# Patient Record
Sex: Female | Born: 1955 | Race: White | Hispanic: No | Smoking: Never smoker
Health system: Southern US, Community
[De-identification: ages and names within clinical notes are randomized; demographics above are authoritative.]

## PROBLEM LIST (undated history)

## (undated) DIAGNOSIS — F4323 Adjustment disorder with mixed anxiety and depressed mood: Secondary | ICD-10-CM

## (undated) DIAGNOSIS — Z789 Other specified health status: Secondary | ICD-10-CM

---

## 2018-12-11 DIAGNOSIS — F439 Reaction to severe stress, unspecified: Secondary | ICD-10-CM | POA: Insufficient documentation

## 2018-12-11 DIAGNOSIS — K5790 Diverticulosis of intestine, part unspecified, without perforation or abscess without bleeding: Secondary | ICD-10-CM | POA: Insufficient documentation

## 2018-12-11 DIAGNOSIS — Z87892 Personal history of anaphylaxis: Secondary | ICD-10-CM | POA: Insufficient documentation

## 2019-01-03 ENCOUNTER — Other Ambulatory Visit: Payer: Self-pay | Admitting: Internal Medicine

## 2019-01-03 DIAGNOSIS — Z1231 Encounter for screening mammogram for malignant neoplasm of breast: Secondary | ICD-10-CM

## 2019-01-15 ENCOUNTER — Ambulatory Visit
Admission: RE | Admit: 2019-01-15 | Discharge: 2019-01-15 | Disposition: A | Discharge status: Home / Self Care | Payer: BC Managed Care – PPO | Source: Ambulatory Visit | Attending: Internal Medicine | Admitting: Internal Medicine

## 2019-01-15 ENCOUNTER — Encounter: Payer: Self-pay | Admitting: Radiology

## 2019-01-15 DIAGNOSIS — Z1231 Encounter for screening mammogram for malignant neoplasm of breast: Secondary | ICD-10-CM | POA: Insufficient documentation

## 2019-03-12 ENCOUNTER — Ambulatory Visit
Admission: EM | Admit: 2019-03-12 | Discharge: 2019-03-12 | Disposition: A | Discharge status: Home / Self Care | Payer: BC Managed Care – PPO | Attending: Family Medicine | Admitting: Family Medicine

## 2019-03-12 ENCOUNTER — Encounter: Payer: Self-pay | Admitting: Emergency Medicine

## 2019-03-12 ENCOUNTER — Other Ambulatory Visit: Payer: Self-pay

## 2019-03-12 DIAGNOSIS — Z20822 Contact with and (suspected) exposure to covid-19: Secondary | ICD-10-CM | POA: Diagnosis not present

## 2019-03-12 NOTE — Discharge Instructions (Signed)
We will notify you by phone of any positive test results.  Negative tests are resulted and relayed to you through your MyChart.

## 2019-03-12 NOTE — ED Triage Notes (Signed)
Patient here for COVID testing. No symptoms. Needs this for travel.

## 2019-03-12 NOTE — ED Provider Notes (Signed)
[facility]    CSN: [id number] Arrival date & time: 03/12/19  1814      History   Chief Complaint Chief Complaint  Patient presents with  . COVID testing    HPI Paizleigh Wilds is a 64 y.o. female.   Yalda Herd presents with requests for covid-19 testing. She is travelling to [place] to visit her mother in an assisted living situation, therefore is seeking testing prior to this visit for peace of mind for herself and the facility. She is feeling well and denies any symptoms of covid-19. She has not had any known exposures to covid-19.    ROS per HPI, negative if not otherwise mentioned.      History reviewed. No pertinent past medical history.  There are no problems to display for this patient.   History reviewed. No pertinent surgical history.  OB History   No obstetric history on file.      Home Medications    Prior to Admission medications   Not on File    Family History History reviewed. No pertinent family history.  Social History Social History   Tobacco Use  . Smoking status: Never Smoker  . Smokeless tobacco: Never Used  Substance Use Topics  . Alcohol use: Never  . Drug use: Never     Allergies   Patient has no known allergies.   Review of Systems Review of Systems   Physical Exam Triage Vital Signs ED Triage Vitals  Enc Vitals Group     BP 03/12/19 1835 115/87     Pulse Rate 03/12/19 1835 72     Resp 03/12/19 1835 18     Temp 03/12/19 1835 98.3 F (36.8 C)     Temp Source 03/12/19 1835 Oral     SpO2 03/12/19 1835 99 %     Weight 03/12/19 1833 120 lb (54.4 kg)     Height 03/12/19 1833 5\' 5"  (1.651 m)     Head Circumference --      Peak Flow --      Pain Score 03/12/19 1833 0     Pain Loc --      Pain Edu? --      Excl. in GC? --    No data found.  Updated Vital Signs BP 115/87 (BP Location: Right Arm)   Pulse 72   Temp 98.3 F (36.8 C) (Oral)   Resp 18   Ht 5\' 5"  (1.651 m)   Wt 120 lb (54.4 kg)    SpO2 99%   BMI 19.97 kg/m   Physical Exam Constitutional:      General: She is not in acute distress.    Appearance: She is well-developed.  Cardiovascular:     Rate and Rhythm: Normal rate.  Pulmonary:     Effort: Pulmonary effort is normal.  Skin:    General: Skin is warm and dry.  Neurological:     Mental Status: She is alert and oriented to person, place, and time.      UC Treatments / Results  Labs (all labs ordered are listed, but only abnormal results are displayed) Labs Reviewed  NOVEL CORONAVIRUS, NAA (HOSP ORDER, SEND-OUT TO REF LAB; TAT 18-24 HRS)    EKG   Radiology No results found.  Procedures Procedures (including critical care time)  Medications Ordered in UC Medications - No data to display  Initial Impression / Assessment and Plan / UC Course  I have reviewed the triage vital signs and the nursing notes.  Pertinent labs & imaging results that were available during my care of the patient were reviewed by me and considered in my medical decision making (see chart for details).     No acute complaints tonight. covid-19 testing prior to travel to visit her mother in assisted living. Testing expectations and results time frame discussed. Patient verbalized understanding and agreeable to plan.   Final Clinical Impressions(s) / UC Diagnoses   Final diagnoses:  Encounter for laboratory testing for COVID-19 virus     Discharge Instructions     We will notify you by phone of any positive test results.  Negative tests are resulted and relayed to you through your MyChart.     ED Prescriptions    None     PDMP not reviewed this encounter.   Zigmund Gottron, NP 03/12/19 1851

## 2019-03-14 LAB — NOVEL CORONAVIRUS, NAA (HOSP ORDER, SEND-OUT TO REF LAB; TAT 18-24 HRS): SARS-CoV-2, NAA: NOT DETECTED

## 2020-03-02 ENCOUNTER — Other Ambulatory Visit: Payer: Self-pay | Admitting: Internal Medicine

## 2020-03-02 DIAGNOSIS — Z1231 Encounter for screening mammogram for malignant neoplasm of breast: Secondary | ICD-10-CM

## 2020-03-16 ENCOUNTER — Other Ambulatory Visit: Payer: Self-pay

## 2020-03-16 ENCOUNTER — Ambulatory Visit
Admission: RE | Admit: 2020-03-16 | Discharge: 2020-03-16 | Disposition: A | Discharge status: Home / Self Care | Payer: BC Managed Care – PPO | Source: Ambulatory Visit | Attending: Internal Medicine | Admitting: Internal Medicine

## 2020-03-16 DIAGNOSIS — Z1231 Encounter for screening mammogram for malignant neoplasm of breast: Secondary | ICD-10-CM | POA: Insufficient documentation

## 2020-07-30 DIAGNOSIS — Z0000 Encounter for general adult medical examination without abnormal findings: Secondary | ICD-10-CM | POA: Insufficient documentation

## 2021-04-28 ENCOUNTER — Other Ambulatory Visit: Payer: Self-pay | Admitting: Internal Medicine

## 2021-04-28 DIAGNOSIS — Z1231 Encounter for screening mammogram for malignant neoplasm of breast: Secondary | ICD-10-CM

## 2021-06-07 ENCOUNTER — Ambulatory Visit
Admission: RE | Admit: 2021-06-07 | Discharge: 2021-06-07 | Disposition: A | Discharge status: Home / Self Care | Payer: Medicare Other | Source: Ambulatory Visit | Attending: Internal Medicine | Admitting: Internal Medicine

## 2021-06-07 DIAGNOSIS — Z1231 Encounter for screening mammogram for malignant neoplasm of breast: Secondary | ICD-10-CM | POA: Diagnosis not present

## 2021-12-16 ENCOUNTER — Ambulatory Visit
Admission: EM | Admit: 2021-12-16 | Discharge: 2021-12-16 | Disposition: A | Discharge status: Home / Self Care | Payer: Medicare Other

## 2021-12-22 DIAGNOSIS — F331 Major depressive disorder, recurrent, moderate: Secondary | ICD-10-CM | POA: Insufficient documentation

## 2021-12-22 DIAGNOSIS — F330 Major depressive disorder, recurrent, mild: Secondary | ICD-10-CM | POA: Insufficient documentation

## 2021-12-23 IMAGING — MG MM DIGITAL SCREENING BILAT W/ TOMO AND CAD
2 series · 3 of 6 positions shown · non-contrast
Comparison: Previous exam(s).

CLINICAL DATA: Screening.

EXAM:
DIGITAL SCREENING BILATERAL MAMMOGRAM WITH TOMOSYNTHESIS AND CAD
TECHNIQUE: Bilateral screening digital craniocaudal and mediolateral oblique
mammograms were obtained. Bilateral screening digital breast
tomosynthesis was performed. The images were evaluated with
computer-aided detection.

[R CC synth-2D]
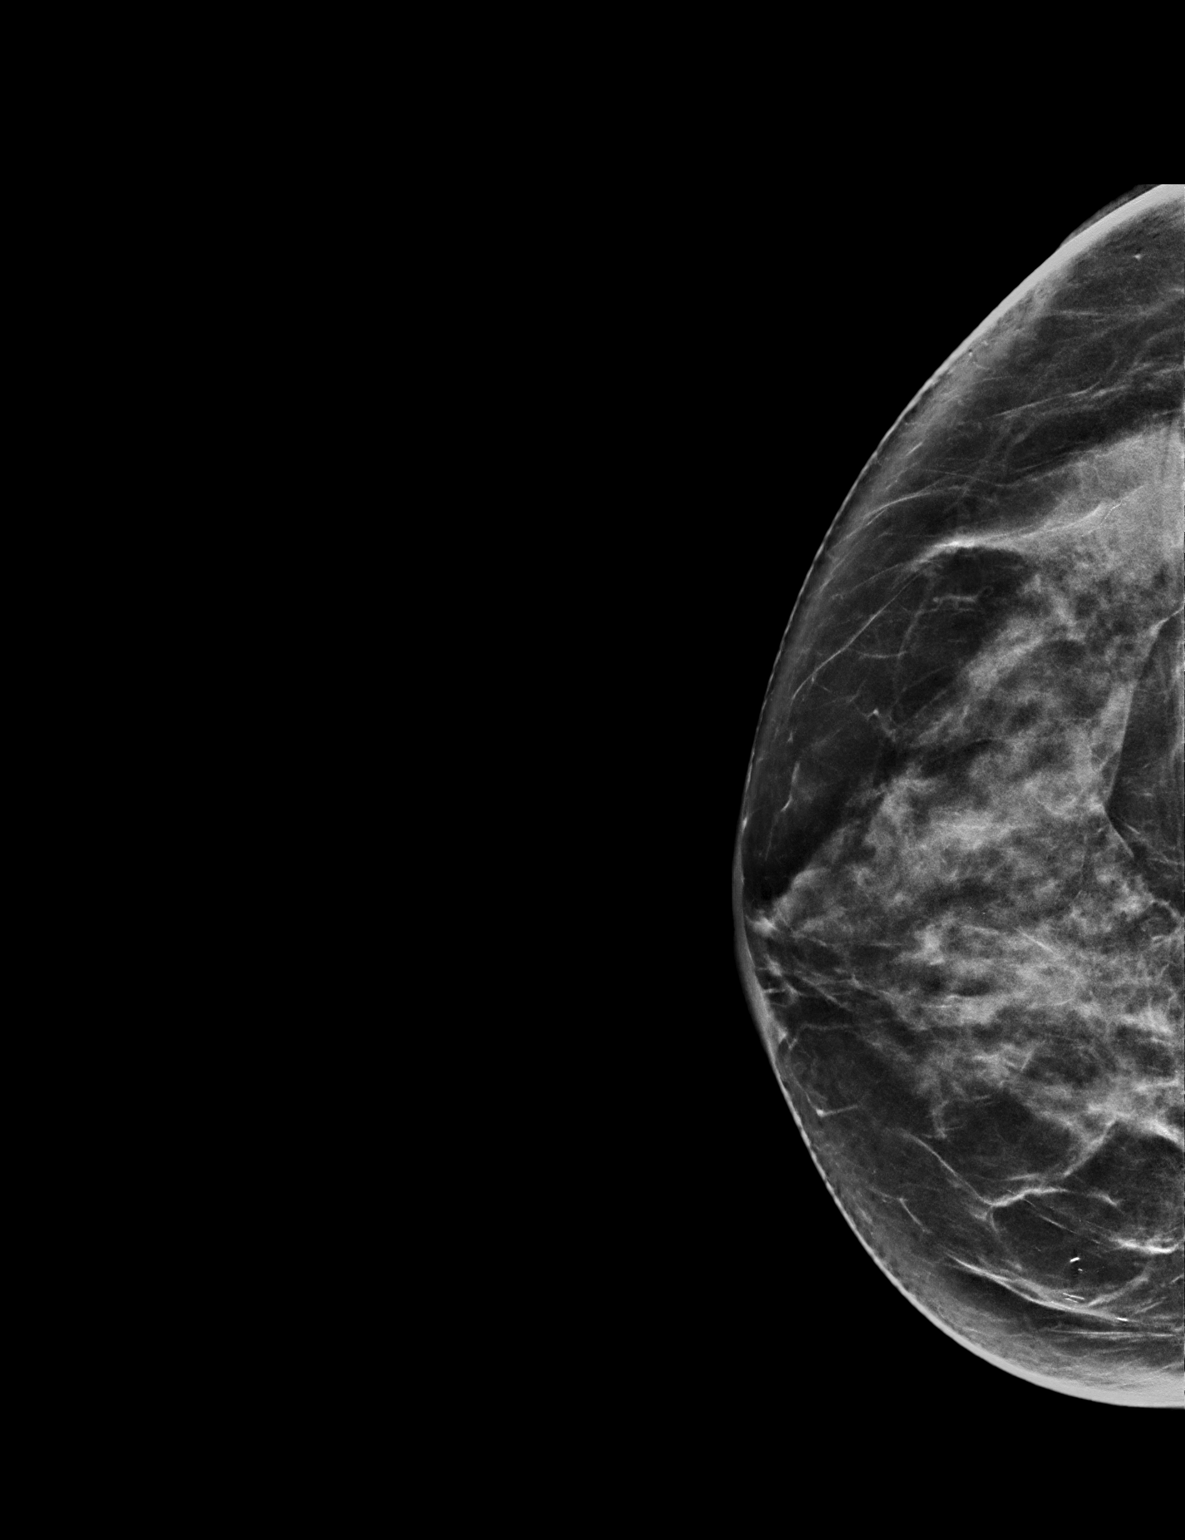

[R CC tomo · 2 of 68 frames shown]
[frame 22/68]
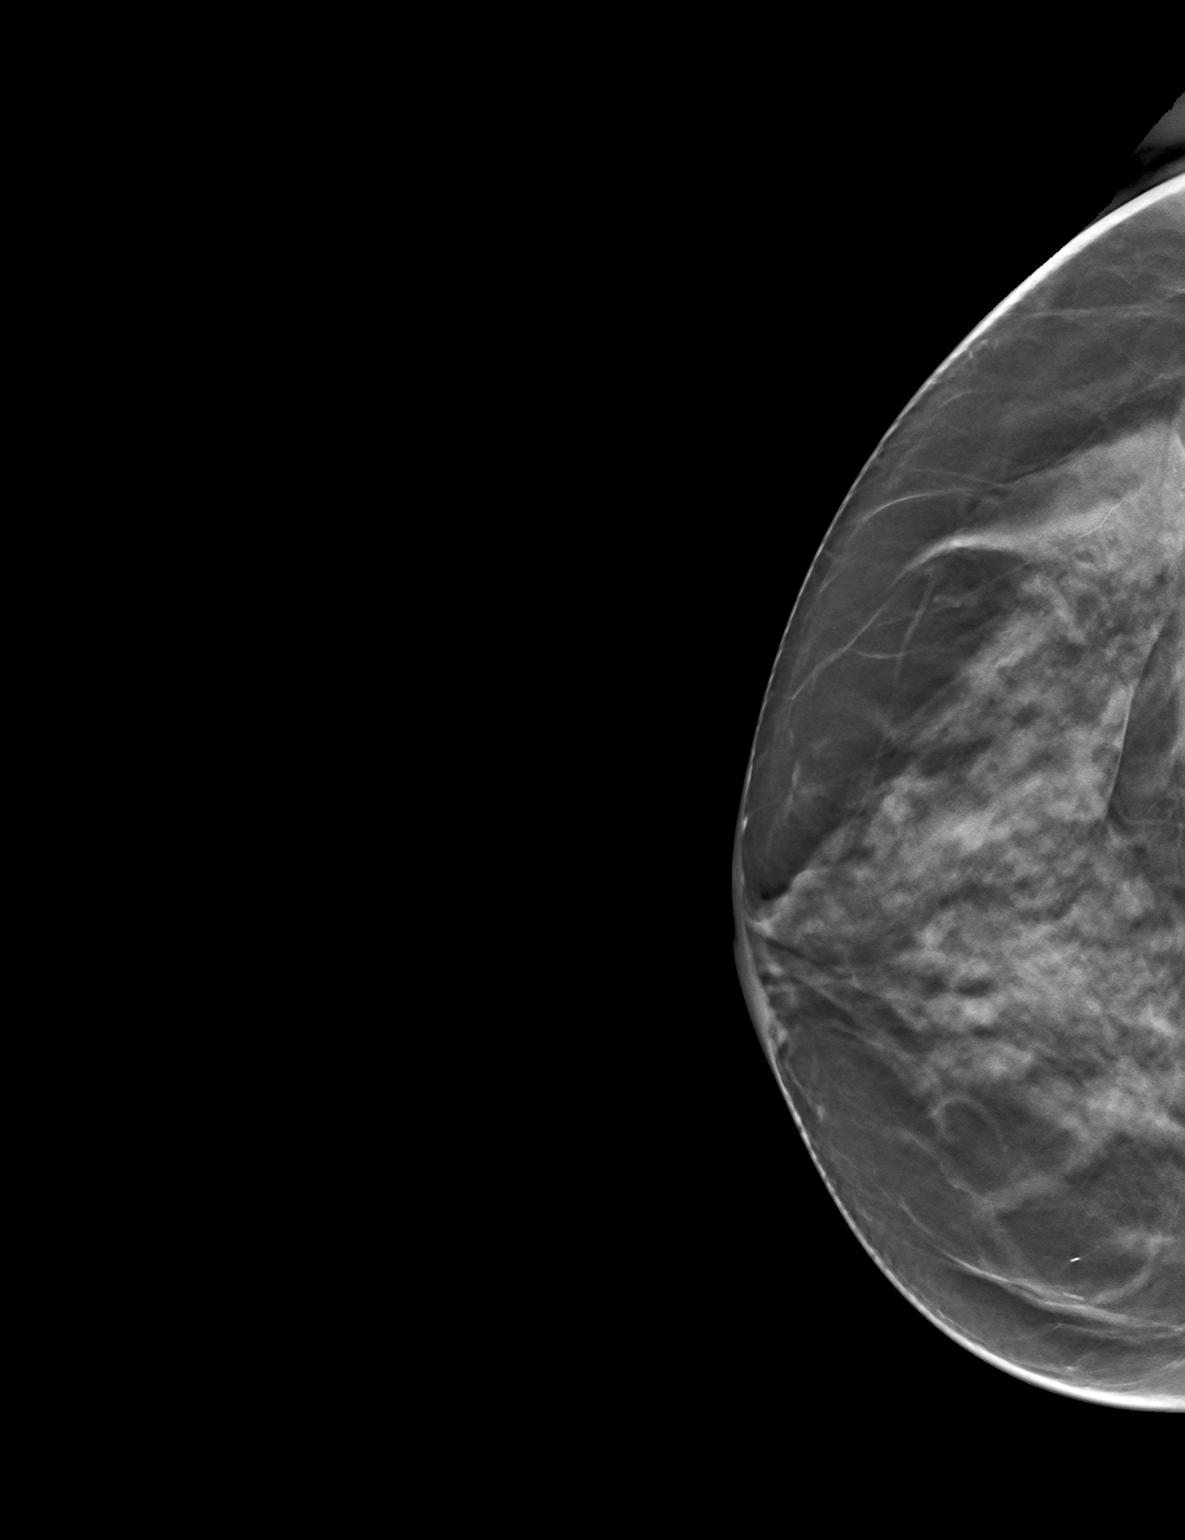
[frame 35/68]
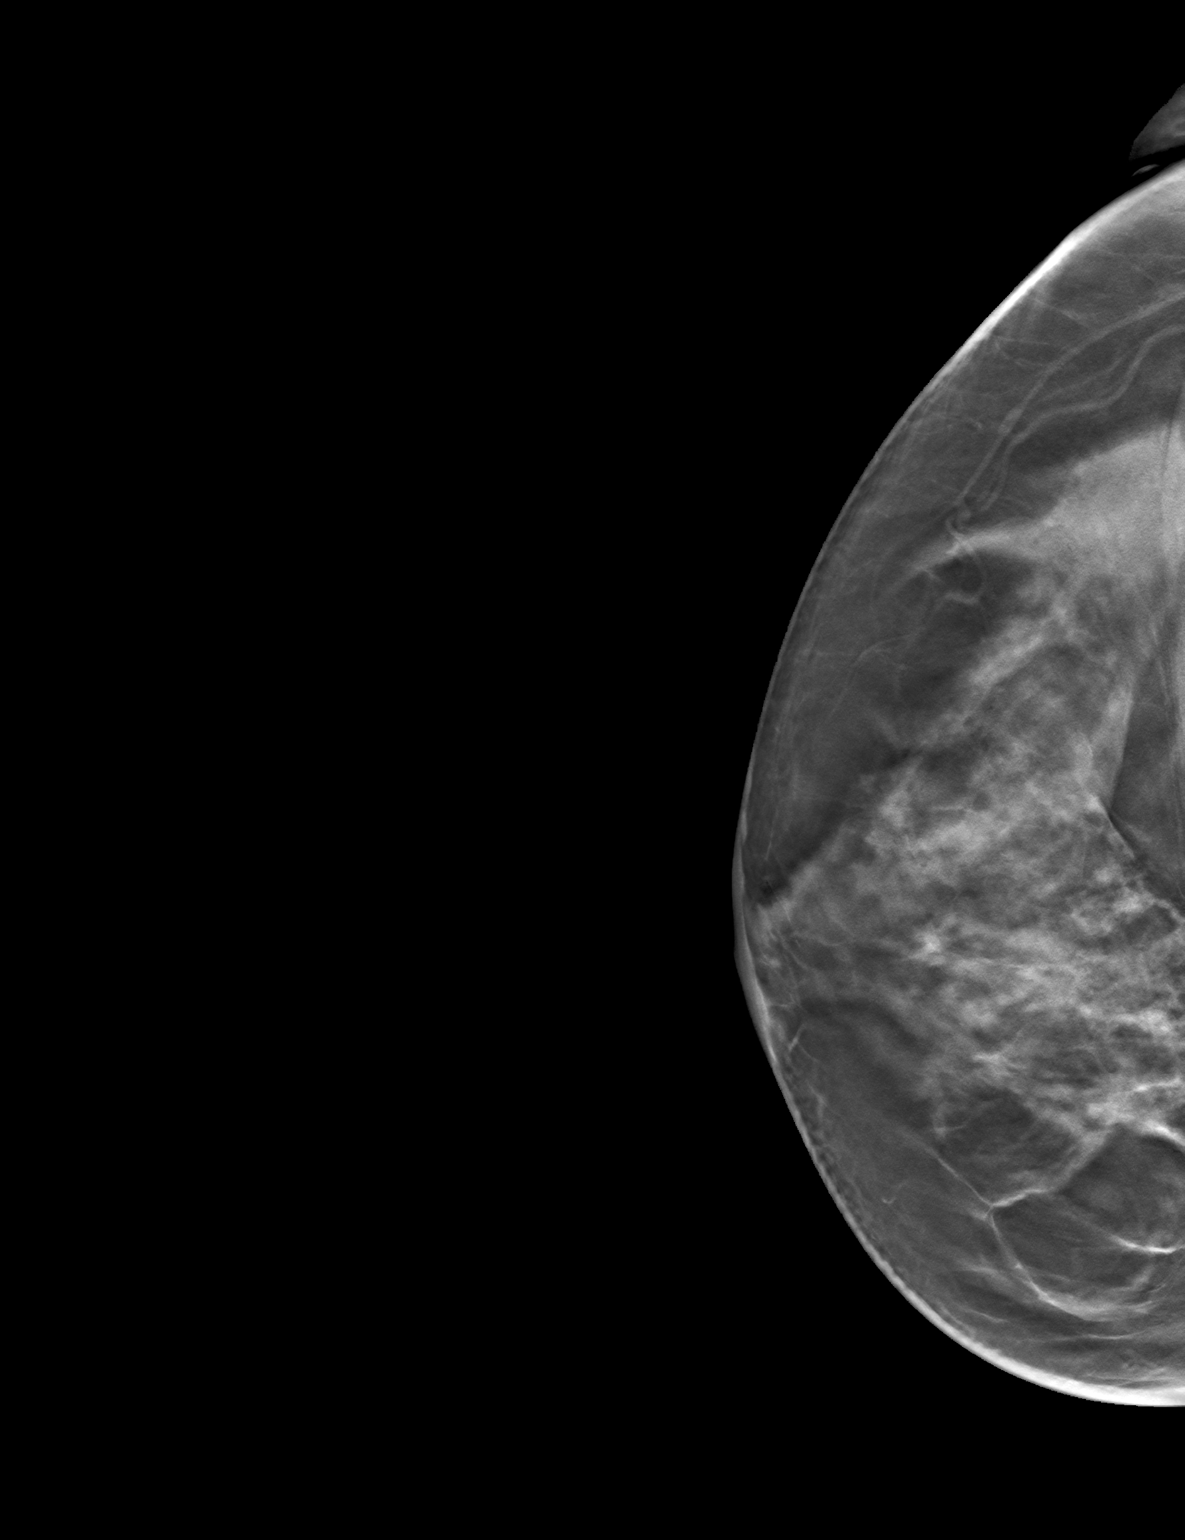

[3 of 6 positions shown; findings below may reference images not displayed]

ACR Breast Density Category c: The breast tissue is heterogeneously
dense, which may obscure small masses.
FINDINGS: There are no findings suspicious for malignancy. The images were
evaluated with computer-aided detection.
IMPRESSION: No mammographic evidence of malignancy. A result letter of this
screening mammogram will be mailed directly to the patient.

RECOMMENDATION:
Screening mammogram in one year. (Code:T4-5-GWO)

BI-RADS CATEGORY  1: Negative.

## 2022-02-15 DIAGNOSIS — Z681 Body mass index (BMI) 19 or less, adult: Secondary | ICD-10-CM | POA: Insufficient documentation

## 2022-03-22 DIAGNOSIS — F4321 Adjustment disorder with depressed mood: Secondary | ICD-10-CM | POA: Insufficient documentation

## 2022-03-22 DIAGNOSIS — F4323 Adjustment disorder with mixed anxiety and depressed mood: Secondary | ICD-10-CM | POA: Insufficient documentation

## 2022-04-18 DIAGNOSIS — K115 Sialolithiasis: Secondary | ICD-10-CM | POA: Insufficient documentation

## 2022-05-01 ENCOUNTER — Other Ambulatory Visit: Payer: Self-pay | Admitting: Internal Medicine

## 2022-05-01 DIAGNOSIS — Z1231 Encounter for screening mammogram for malignant neoplasm of breast: Secondary | ICD-10-CM

## 2022-05-22 ENCOUNTER — Telehealth: Payer: Self-pay

## 2022-05-22 NOTE — Telephone Encounter (Signed)
Patient left a message stating her PCP referred her to our office for blood in stool and she needs to make a appointment. Reviewed chart and do not have the referral referring her to our office. Called patient back and informed her this information and she states she will get her provider to refax it to our office.

## 2022-05-23 DIAGNOSIS — N950 Postmenopausal bleeding: Secondary | ICD-10-CM | POA: Insufficient documentation

## 2022-05-29 ENCOUNTER — Other Ambulatory Visit: Payer: Self-pay | Admitting: Obstetrics and Gynecology

## 2022-05-30 NOTE — Telephone Encounter (Signed)
Pt left another message regarding the referral to our office. Returned pt's call but had to leave a message for pt. Requested a call to confirm PCP as I will call myself to locate the referral.

## 2022-06-05 ENCOUNTER — Other Ambulatory Visit: Payer: Self-pay

## 2022-06-12 ENCOUNTER — Ambulatory Visit
Admission: RE | Admit: 2022-06-12 | Discharge: 2022-06-12 | Disposition: A | Discharge status: Home / Self Care | Payer: Medicare Other | Source: Ambulatory Visit | Attending: Internal Medicine | Admitting: Internal Medicine

## 2022-06-12 DIAGNOSIS — Z1231 Encounter for screening mammogram for malignant neoplasm of breast: Secondary | ICD-10-CM | POA: Insufficient documentation

## 2022-06-13 ENCOUNTER — Inpatient Hospital Stay: Admission: RE | Admit: 2022-06-13 | Payer: Medicare Other | Source: Ambulatory Visit

## 2022-07-20 ENCOUNTER — Other Ambulatory Visit: Payer: Self-pay

## 2022-07-20 DIAGNOSIS — Z1211 Encounter for screening for malignant neoplasm of colon: Secondary | ICD-10-CM

## 2022-07-20 MED ORDER — CLENPIQ 10-3.5-12 MG-GM -GM/160ML PO SOLN: 320.0000 mL | ORAL | 0 refills | Status: DC

## 2022-07-26 ENCOUNTER — Telehealth: Payer: Self-pay | Admitting: Gastroenterology

## 2022-07-26 NOTE — Telephone Encounter (Signed)
Pt left message prep for procedure on 10/19/2022 was sent to [facility] should  of been sent to [facility]

## 2022-07-31 ENCOUNTER — Encounter: Payer: Self-pay | Admitting: Obstetrics and Gynecology

## 2022-07-31 ENCOUNTER — Other Ambulatory Visit: Payer: Self-pay | Admitting: Obstetrics and Gynecology

## 2022-07-31 DIAGNOSIS — N950 Postmenopausal bleeding: Secondary | ICD-10-CM

## 2022-07-31 DIAGNOSIS — R9389 Abnormal findings on diagnostic imaging of other specified body structures: Secondary | ICD-10-CM

## 2022-07-31 MED ORDER — CLENPIQ 10-3.5-12 MG-GM -GM/160ML PO SOLN: 320.0000 mL | ORAL | 0 refills | Status: AC

## 2022-07-31 NOTE — Addendum Note (Signed)
Addended by: Radene Knee L on: 07/31/2022 09:58 AM   Modules accepted: Orders

## 2022-07-31 NOTE — Telephone Encounter (Signed)
This medication was sent on 07/20/2022 and resent to [facility] per Request of the patient

## 2022-09-07 ENCOUNTER — Encounter: Payer: Self-pay | Admitting: Gastroenterology

## 2022-09-07 NOTE — Anesthesia Preprocedure Evaluation (Addendum)
Anesthesia Evaluation  Patient identified by MRN, date of birth, ID band Patient awake    Reviewed: Allergy & Precautions, H&P , NPO status , Patient's Chart, lab work & pertinent test results  Airway Mallampati: II  TM Distance: >3 FB Neck ROM: Full    Dental no notable dental hx.    Pulmonary neg pulmonary ROS   Pulmonary exam normal breath sounds clear to auscultation       Cardiovascular negative cardio ROS Normal cardiovascular exam Rhythm:Regular Rate:Normal     Neuro/Psych  PSYCHIATRIC DISORDERS  Depression    negative neurological ROS  negative psych ROS   GI/Hepatic negative GI ROS, Neg liver ROS,,,  Endo/Other  negative endocrine ROS    Renal/GU negative Renal ROS  negative genitourinary   Musculoskeletal negative musculoskeletal ROS (+)    Abdominal   Peds negative pediatric ROS (+)  Hematology negative hematology ROS (+)   Anesthesia Other Findings Mild episode of recurrent major depressive disorder Grief Anxiety and depression  Reproductive/Obstetrics negative OB ROS                              Anesthesia Physical Anesthesia Plan  ASA: 2  Anesthesia Plan: General   Post-op Pain Management:    Induction: Intravenous  PONV Risk Score and Plan:   Airway Management Planned: Natural Airway and Nasal Cannula  Additional Equipment:   Intra-op Plan:   Post-operative Plan:   Informed Consent: I have reviewed the patients History and Physical, chart, labs and discussed the procedure including the risks, benefits and alternatives for the proposed anesthesia with the patient or authorized representative who has indicated his/her understanding and acceptance.     Dental Advisory Given  Plan Discussed with: Anesthesiologist, CRNA and Surgeon  Anesthesia Plan Comments: (Patient consented for risks of anesthesia including but not limited to:  - adverse reactions to  medications - risk of airway placement if required - damage to eyes, teeth, lips or other oral mucosa - nerve damage due to positioning  - sore throat or hoarseness - Damage to heart, brain, nerves, lungs, other parts of body or loss of life  Patient voiced understanding.)         Anesthesia Quick Evaluation

## 2022-09-08 ENCOUNTER — Encounter: Payer: Self-pay | Admitting: Gastroenterology

## 2022-09-13 ENCOUNTER — Telehealth: Payer: Self-pay | Admitting: Gastroenterology

## 2022-09-13 ENCOUNTER — Other Ambulatory Visit: Payer: Medicare Other

## 2022-09-13 NOTE — Telephone Encounter (Signed)
Patient called in because she needs to know the time for her procedure. I inform her she will have to call endo to get her time and I gave her the number and the time.

## 2022-09-14 ENCOUNTER — Ambulatory Visit
Admission: RE | Admit: 2022-09-14 | Discharge: 2022-09-14 | Disposition: A | Discharge status: Home / Self Care | Payer: Medicare Other | Attending: Gastroenterology | Admitting: Gastroenterology

## 2022-09-14 ENCOUNTER — Ambulatory Visit: Payer: Medicare Other | Admitting: Anesthesiology

## 2022-09-14 ENCOUNTER — Encounter
Admission: RE | Disposition: A | Discharge status: Home / Self Care | Payer: Self-pay | Source: Home / Self Care | Attending: Gastroenterology

## 2022-09-14 ENCOUNTER — Encounter: Payer: Self-pay | Admitting: Gastroenterology

## 2022-09-14 ENCOUNTER — Other Ambulatory Visit: Payer: Self-pay

## 2022-09-14 DIAGNOSIS — K5730 Diverticulosis of large intestine without perforation or abscess without bleeding: Secondary | ICD-10-CM | POA: Diagnosis not present

## 2022-09-14 DIAGNOSIS — Z1211 Encounter for screening for malignant neoplasm of colon: Secondary | ICD-10-CM | POA: Insufficient documentation

## 2022-09-14 HISTORY — DX: Adjustment disorder with mixed anxiety and depressed mood: F43.23

## 2022-09-14 HISTORY — PX: COLONOSCOPY WITH PROPOFOL: SHX5780

## 2022-09-14 HISTORY — DX: Other specified health status: Z78.9

## 2022-09-14 SURGERY — COLONOSCOPY WITH PROPOFOL
Anesthesia: General

## 2022-09-14 MED ORDER — LACTATED RINGERS IV SOLN: INTRAVENOUS | Status: DC

## 2022-09-14 MED ORDER — STERILE WATER FOR IRRIGATION IR SOLN
Status: DC | PRN
  Administered 2022-09-14: 1

## 2022-09-14 MED ORDER — SODIUM CHLORIDE 0.9 % IV SOLN: INTRAVENOUS | Status: DC

## 2022-09-14 MED ORDER — LIDOCAINE HCL (CARDIAC) PF 100 MG/5ML IV SOSY
PREFILLED_SYRINGE | INTRAVENOUS | Status: DC | PRN
  Administered 2022-09-14: 60 mg via INTRAVENOUS

## 2022-09-14 MED ORDER — LACTATED RINGERS IV SOLN: INTRAVENOUS | Status: DC | PRN

## 2022-09-14 MED ORDER — PROPOFOL 10 MG/ML IV BOLUS
INTRAVENOUS | Status: DC | PRN
  Administered 2022-09-14: 50 mg via INTRAVENOUS
  Administered 2022-09-14: 20 mg via INTRAVENOUS
  Administered 2022-09-14: 40 mg via INTRAVENOUS
  Administered 2022-09-14: 100 mg via INTRAVENOUS
  Administered 2022-09-14: 40 mg via INTRAVENOUS

## 2022-09-14 SURGICAL SUPPLY — 6 items
GOWN CVR UNV OPN BCK APRN NK (MISCELLANEOUS) ×2 IMPLANT
GOWN ISOL THUMB LOOP REG UNIV (MISCELLANEOUS) ×2
KIT PRC NS LF DISP ENDO (KITS) ×1 IMPLANT
KIT PROCEDURE OLYMPUS (KITS) ×1
MANIFOLD NEPTUNE II (INSTRUMENTS) ×1 IMPLANT
WATER STERILE IRR 250ML POUR (IV SOLUTION) ×1 IMPLANT

## 2022-09-14 NOTE — Op Note (Signed)
[facility] Patient Name: Jeanette Bowers Procedure Date: 09/14/2022 10:58 AM MRN: [id number] Account #: [id number] Date of Birth: 07/10/1955 Admit Type: Outpatient Age: 67 Room: [facility] OR ROOM 01 Gender: Female Note Status: Finalized Instrument Name: Peds Colonoscope [id number] Procedure:             Colonoscopy Indications:           Screening for colorectal malignant neoplasm, Last                         colonoscopy 10 years ago Providers:             Toney Reil MD, MD Referring MD:          Marguerita Beards (Referring MD) Medicines:             General Anesthesia Complications:         No immediate complications. Estimated blood loss: None. Procedure:             Pre-Anesthesia Assessment:                        - Prior to the procedure, a History and Physical was                         performed, and patient medications and allergies were                         reviewed. The patient is competent. The risks and                         benefits of the procedure and the sedation options and                         risks were discussed with the patient. All questions                         were answered and informed consent was obtained.                         Patient identification and proposed procedure were                         verified by the physician, the nurse, the                         anesthesiologist, the anesthetist and the technician                         in the pre-procedure area in the procedure room in the                         endoscopy suite. Mental Status Examination: alert and                         oriented. Airway Examination: normal oropharyngeal                         airway and neck mobility. Respiratory Examination:  clear to auscultation. CV Examination: normal.                         Prophylactic Antibiotics: The patient does not require                         prophylactic  antibiotics. Prior Anticoagulants: The                         patient has taken no anticoagulant or antiplatelet                         agents. ASA Grade Assessment: II - A patient with mild                         systemic disease. After reviewing the risks and                         benefits, the patient was deemed in satisfactory                         condition to undergo the procedure. The anesthesia                         plan was to use general anesthesia. Immediately prior                         to administration of medications, the patient was                         re-assessed for adequacy to receive sedatives. The                         heart rate, respiratory rate, oxygen saturations,                         blood pressure, adequacy of pulmonary ventilation, and                         response to care were monitored throughout the                         procedure. The physical status of the patient was                         re-assessed after the procedure.                        After obtaining informed consent, the colonoscope was                         passed under direct vision. Throughout the procedure,                         the patient's blood pressure, pulse, and oxygen                         saturations were monitored continuously. The  colonoscopy was performed with moderate difficulty due                         to multiple diverticula in the colon. Successful                         completion of the procedure was aided by withdrawing                         the scope and replacing with the pediatric colonoscope                         and applying abdominal pressure. The patient tolerated                         the procedure well. The quality of the bowel                         preparation was evaluated using the BBPS [facility] Bowel                         Preparation Scale) with scores of: Right Colon = 3,                          Transverse Colon = 3 and Left Colon = 3 (entire mucosa                         seen well with no residual staining, small fragments                         of stool or opaque liquid). The total BBPS score                         equals 9. The ileocecal valve, appendiceal orifice,                         and rectum were photographed. The Colonoscope was                         introduced through the anus and advanced to the the                         cecum, identified by appendiceal orifice and ileocecal                         valve. Findings:      The perianal and digital rectal examinations were normal. Pertinent       negatives include normal sphincter tone and no palpable rectal lesions.      Multiple small-mouthed diverticula were found in the recto-sigmoid colon       and sigmoid colon.      The retroflexed view of the distal rectum and anal verge was normal and       showed no anal or rectal abnormalities.      The exam was otherwise without abnormality. Impression:            - Diverticulosis in the recto-sigmoid colon and in the  sigmoid colon.                        - The distal rectum and anal verge are normal on                         retroflexion view.                        - The examination was otherwise normal.                        - No specimens collected. Recommendation:        - Discharge patient to home (with escort).                        - Resume previous diet today.                        - Continue present medications.                        - Repeat colonoscopy in 10 years for screening                         purposes. Procedure Code(s):     --- Professional ---                        W2956, Colorectal cancer screening; colonoscopy on                         individual not meeting criteria for high risk Diagnosis Code(s):     --- Professional ---                        K57.30, Diverticulosis of large intestine without                          perforation or abscess without bleeding                        Z12.11, Encounter for screening for malignant neoplasm                         of colon CPT copyright 2022 American Medical Association. All rights reserved. The codes documented in this report are preliminary and upon coder review may  be revised to meet current compliance requirements. Dr. Libby Maw Toney Reil MD, MD 09/14/2022 11:33:10 AM This report has been signed electronically. Number of Addenda: 0 Note Initiated On: 09/14/2022 10:58 AM Scope Withdrawal Time: 0 hours 6 minutes 45 seconds  Total Procedure Duration: 0 hours 17 minutes 48 seconds  Estimated Blood Loss:  Estimated blood loss: none.      [facility]

## 2022-09-14 NOTE — Transfer of Care (Signed)
Immediate Anesthesia Transfer of Care Note  Patient: Jeanette Bowers  Procedure(s) Performed: COLONOSCOPY WITH PROPOFOL  Patient Location: PACU  Anesthesia Type: General  Level of Consciousness: awake, alert  and patient cooperative  Airway and Oxygen Therapy: Patient Spontanous Breathing and Patient connected to supplemental oxygen  Post-op Assessment: Post-op Vital signs reviewed, Patient's Cardiovascular Status Stable, Respiratory Function Stable, Patent Airway and No signs of Nausea or vomiting  Post-op Vital Signs: Reviewed and stable  Complications: No notable events documented.

## 2022-09-14 NOTE — Anesthesia Postprocedure Evaluation (Signed)
Anesthesia Post Note  Patient: Jeanette Bowers  Procedure(s) Performed: COLONOSCOPY WITH PROPOFOL  Patient location during evaluation: PACU Anesthesia Type: General Level of consciousness: awake and alert Pain management: pain level controlled Vital Signs Assessment: post-procedure vital signs reviewed and stable Respiratory status: spontaneous breathing, nonlabored ventilation, respiratory function stable and patient connected to nasal cannula oxygen Cardiovascular status: blood pressure returned to baseline and stable Postop Assessment: no apparent nausea or vomiting Anesthetic complications: no   No notable events documented.   Last Vitals:  Vitals:   09/14/22 1145 09/14/22 1150  BP: 101/76   Pulse: 71 72  Resp: (!) 21 19  Temp:  36.8 C  SpO2: 98% 99%    Last Pain:  Vitals:   09/14/22 1145  TempSrc:   PainSc: 0-No pain                 Virgilene Stryker C Aolani Piggott

## 2022-09-14 NOTE — H&P (Signed)
Arlyss Repress, MD [address]  [address]  [place], [place] [address]  Main: [phone]  Fax: [phone] Pager: [phone]  Primary Care Physician:  Marguerita Beards, MD Primary Gastroenterologist:  Dr. Arlyss Repress  Pre-Procedure History & Physical: HPI:  Jeanette Bowers is a 67 y.o. female is here for an colonoscopy.   Past Medical History:  Diagnosis Date   Adjustment reaction with anxiety and depression    Medical history non-contributory     History reviewed. No pertinent surgical history.  Prior to Admission medications   Medication Sig Start Date End Date Taking? Authorizing Provider  EPINEPHrine 0.3 mg/0.3 mL IJ SOAJ injection Inject 0.3 mg into the muscle as needed for anaphylaxis. 11/20/20  Yes [provider]  hydrOXYzine (ATARAX) 25 MG tablet Take 25 mg by mouth as needed. 03/08/22  Yes [provider]  LORazepam (ATIVAN) 0.5 MG tablet Take 0.5 mg by mouth as needed. 02/13/22 02/13/23 Yes [provider]  Sod Picosulfate-Mag Ox-Cit Acd (CLENPIQ) 10-3.5-12 MG-GM -GM/160ML SOLN Take 320 mLs by mouth as directed. 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   Wasp venom Anaphylaxis 12/11/2018    Family History  Problem Relation Age of Onset   Breast cancer Neg Hx     Social History   Socioeconomic History   Marital status: Divorced    Spouse name: Not on file   Number of children: Not on file   Years of education: Not on file   Highest education level: Not on file  Occupational History   Not on file  Tobacco Use   Smoking status: Never   Smokeless tobacco: Never  Vaping Use   Vaping status: Never Used  Substance and Sexual Activity   Alcohol use: Never   Drug use: Never   Sexual activity: Not on file  Other Topics Concern   Not on file  Social History Narrative   Not on file   Social Determinants  of Health   Financial Resource Strain: Low Risk  (03/22/2022)   Received from [facility], [facility]   Overall Financial Resource Strain (CARDIA)    Difficulty of Paying Living Expenses: Not hard at all  Food Insecurity: No Food Insecurity (03/22/2022)   Received from [facility], [facility]   Hunger Vital Sign    Worried About Running Out of Food in the Last Year: Never true    Ran Out of Food in the Last Year: Never true  Transportation Needs: No Transportation Needs (03/22/2022)   Received from [facility], [facility]   [facility] - Transportation    Lack of Transportation (Medical): No    Lack of Transportation (Non-Medical): No  Physical Activity: Inactive (07/20/2020)   Received from [facility], [facility]   Exercise Vital Sign    Days of Exercise per Week: 0 days    Minutes of Exercise per Session: 20 min  Stress: No Stress Concern Present (07/20/2020)   Received from [facility], [facility] - Occupational Stress Questionnaire    Feeling of Stress : Only a little  Social Connections: Socially Isolated (07/20/2020)   Received from [facility], [facility]   Social Connection and Isolation Panel [NHANES]    Frequency of Communication with Friends and Family: More than three  times a week    Frequency of Social Gatherings with Friends and Family: More than three times a week    Attends Religious Services: Never    Database administrator or Organizations: No    Attends Banker Meetings: Never    Marital Status: Divorced  Catering manager Violence: Not At Risk (03/22/2022)   Received from [facility], [facility]   Humiliation, Afraid, Rape, and Kick questionnaire    Fear of Current or Ex-Partner: No    Emotionally Abused: No    Physically Abused: No    Sexually Abused: No    Review of Systems: See HPI, otherwise negative ROS  Physical Exam: BP 116/82   Temp  98.5 F (36.9 C) (Temporal)   Resp 14   Ht 5' 4.5" (1.638 m)   Wt 51.6 kg   SpO2 98%   BMI 19.23 kg/m  General:   Alert,  pleasant and cooperative in NAD Head:  Normocephalic and atraumatic. Neck:  Supple; no masses or thyromegaly. Lungs:  Clear throughout to auscultation.    Heart:  Regular rate and rhythm. Abdomen:  Soft, nontender and nondistended. Normal bowel sounds, without guarding, and without rebound.   Neurologic:  Alert and  oriented x4;  grossly normal neurologically.  Impression/Plan: Jeanette Bowers is here for an colonoscopy to be performed for colon cancer screening  Risks, benefits, limitations, and alternatives regarding  colonoscopy have been reviewed with the patient.  Questions have been answered.  All parties agreeable.   Lannette Donath, MD  09/14/2022, 11:01 AM

## 2022-09-15 ENCOUNTER — Encounter: Payer: Self-pay | Admitting: Gastroenterology

## 2022-09-21 ENCOUNTER — Ambulatory Visit: Admit: 2022-09-21 | Payer: Medicare Other | Admitting: Obstetrics and Gynecology

## 2022-09-21 SURGERY — DILATATION AND CURETTAGE /HYSTEROSCOPY
Anesthesia: Choice

## 2022-11-02 ENCOUNTER — Ambulatory Visit
Admission: RE | Admit: 2022-11-02 | Discharge: 2022-11-02 | Disposition: A | Discharge status: Home / Self Care | Payer: Medicare Other | Source: Ambulatory Visit | Attending: Obstetrics and Gynecology | Admitting: Obstetrics and Gynecology

## 2022-11-02 DIAGNOSIS — R9389 Abnormal findings on diagnostic imaging of other specified body structures: Secondary | ICD-10-CM | POA: Diagnosis present

## 2022-11-02 DIAGNOSIS — N950 Postmenopausal bleeding: Secondary | ICD-10-CM | POA: Diagnosis present

## 2023-03-09 IMAGING — MG MM DIGITAL SCREENING BILAT W/ TOMO AND CAD
8 series · 9 of 24 positions shown · non-contrast
Comparison: Previous exam(s).

CLINICAL DATA: Screening.

EXAM:
DIGITAL SCREENING BILATERAL MAMMOGRAM WITH TOMOSYNTHESIS AND CAD
TECHNIQUE: Bilateral screening digital craniocaudal and mediolateral oblique
mammograms were obtained. Bilateral screening digital breast
tomosynthesis was performed. The images were evaluated with
computer-aided detection.

[L CC synth-2D]
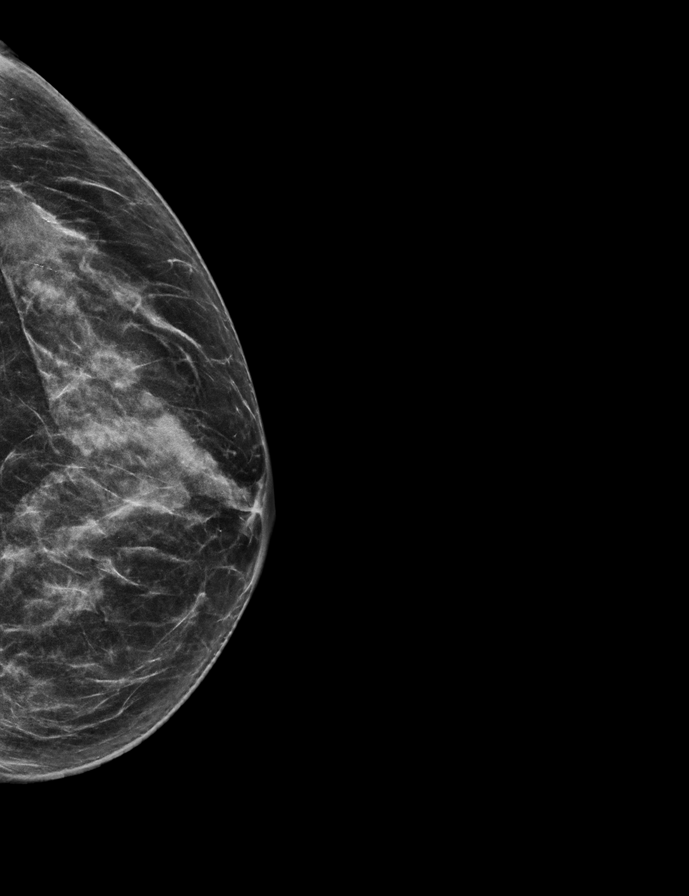

[L MLO synth-2D]
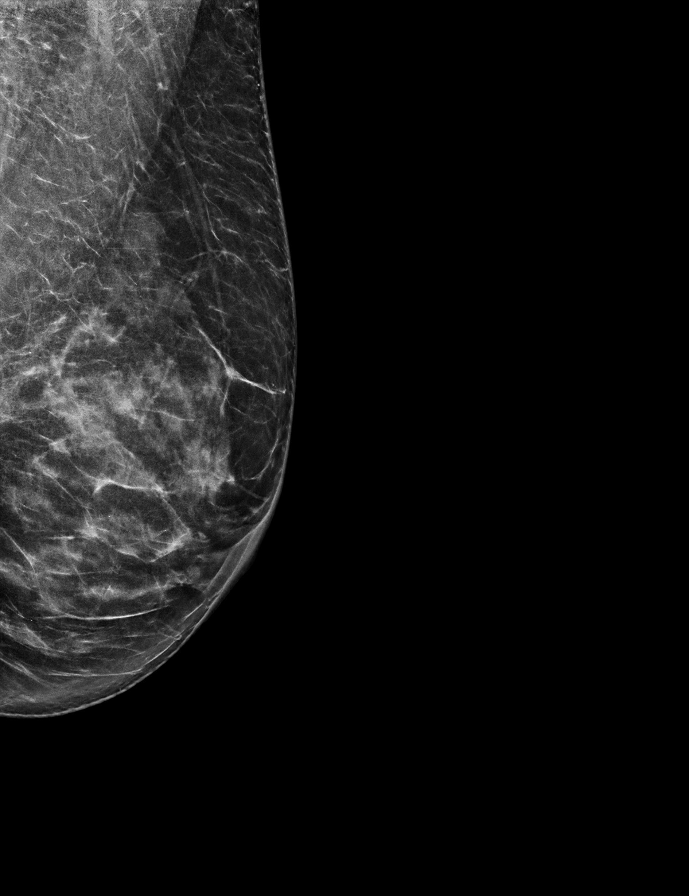

[R MLO synth-2D]
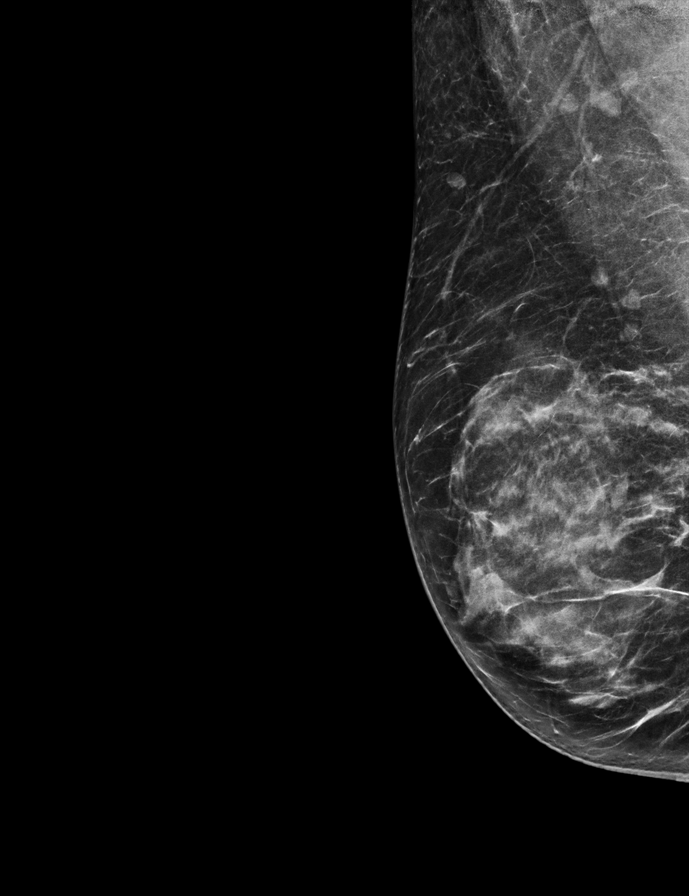

[R CC synth-2D]
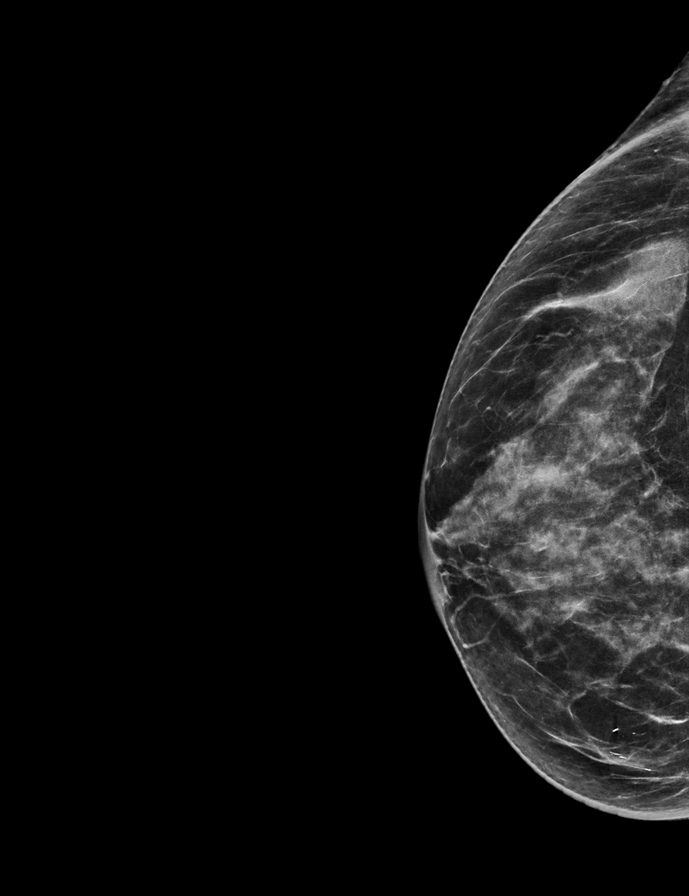

[L CC tomo · 2 of 62 frames shown]
[frame 21/62]
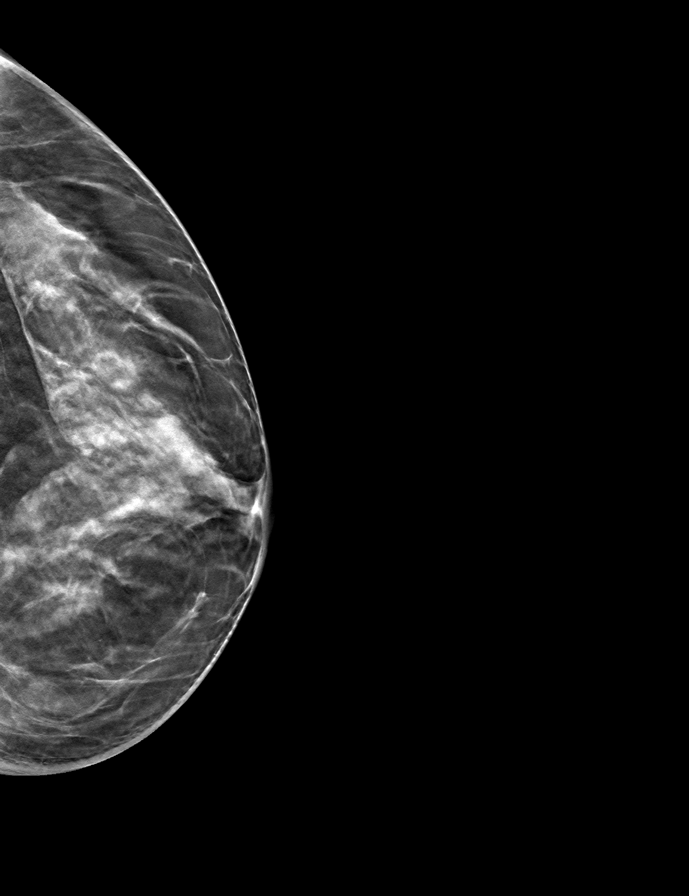
[frame 31/62]
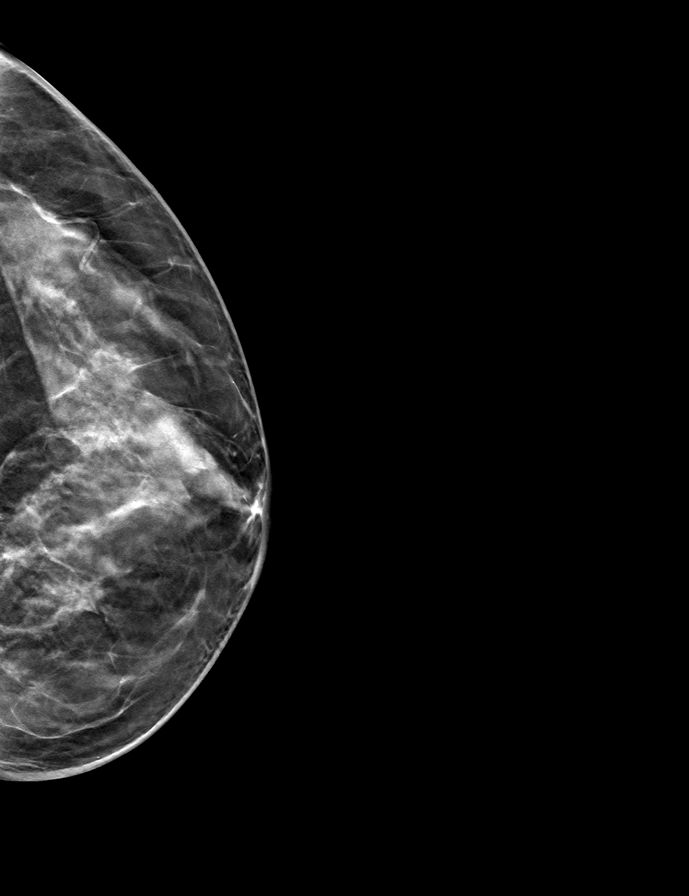

[R CC tomo · tomo slice 32/63.0]
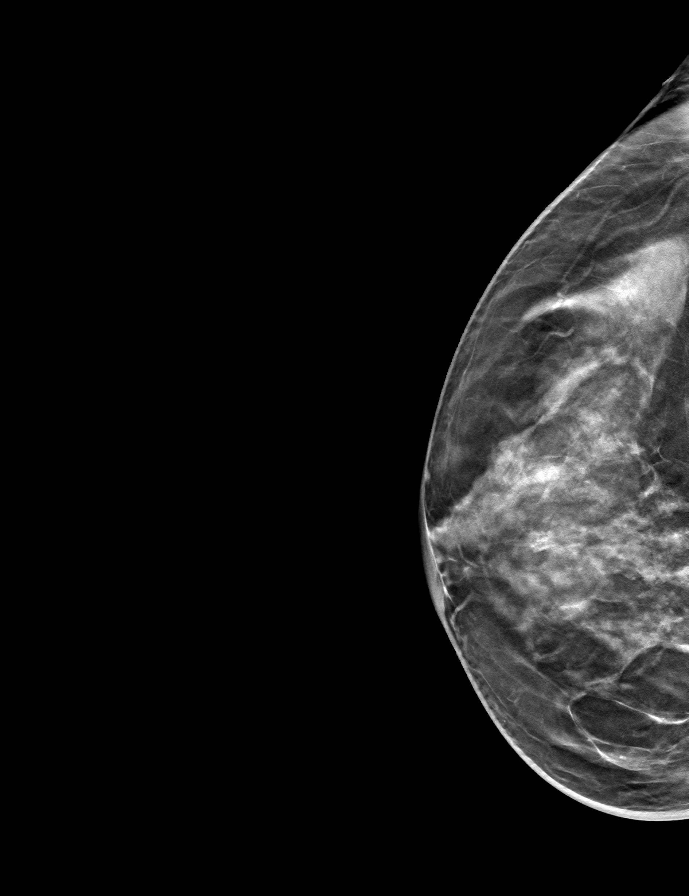

[L MLO tomo · tomo slice 31/62.0]
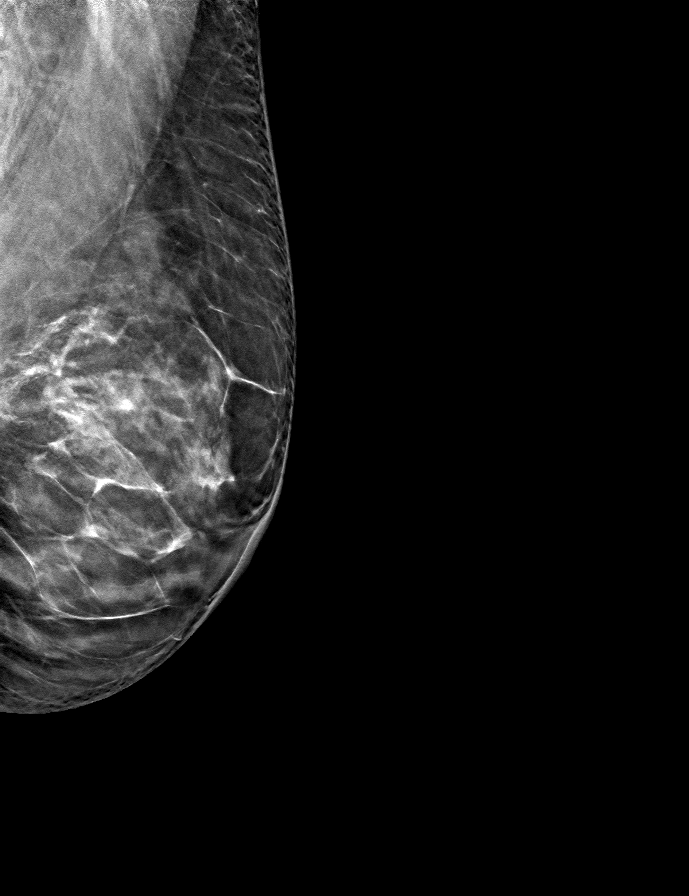

[R MLO tomo · tomo slice 33/64.0]
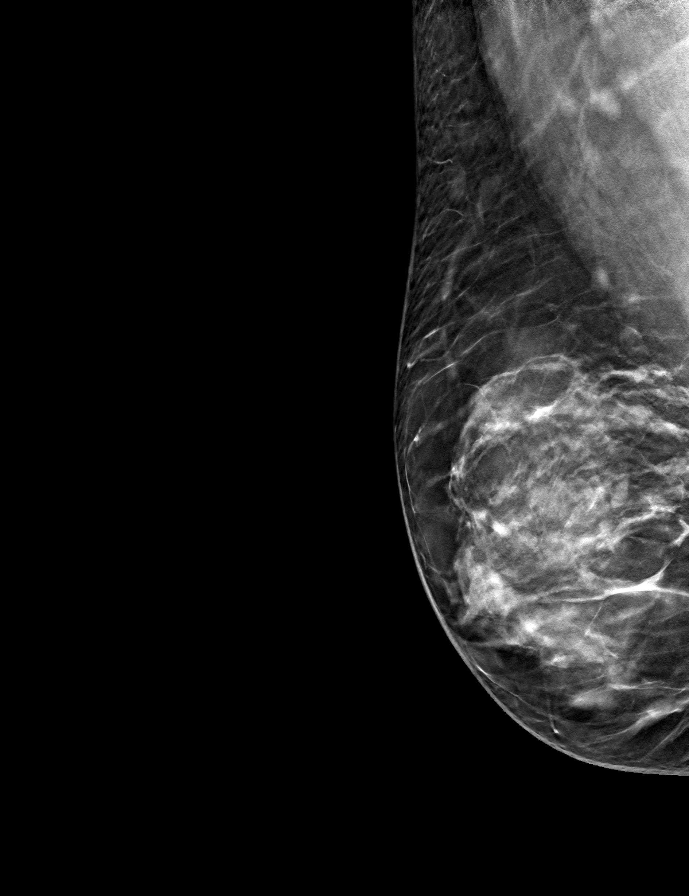

[9 of 24 positions shown; findings below may reference images not displayed]

ACR Breast Density Category c: The breast tissue is heterogeneously
dense, which may obscure small masses.
FINDINGS: There are no findings suspicious for malignancy.
IMPRESSION: No mammographic evidence of malignancy. A result letter of this
screening mammogram will be mailed directly to the patient.

RECOMMENDATION:
Screening mammogram in one year. (Code:Q3-W-BC3)

BI-RADS CATEGORY  1: Negative.
